# Patient Record
Sex: Male | Born: 1955 | Race: White | Hispanic: No | Marital: Married | Smoking: Never smoker
Health system: Southern US, Community
[De-identification: ages and names within clinical notes are randomized; demographics above are authoritative.]

---

## 2004-09-20 ENCOUNTER — Ambulatory Visit: Payer: Self-pay | Admitting: Internal Medicine

## 2005-01-24 ENCOUNTER — Ambulatory Visit: Payer: Self-pay | Admitting: Gastroenterology

## 2008-09-29 ENCOUNTER — Ambulatory Visit: Payer: Self-pay | Admitting: Family Medicine

## 2010-06-16 ENCOUNTER — Ambulatory Visit: Payer: Self-pay | Admitting: Internal Medicine

## 2016-01-08 ENCOUNTER — Other Ambulatory Visit: Payer: Self-pay | Admitting: Neurology

## 2016-01-08 DIAGNOSIS — G3184 Mild cognitive impairment, so stated: Secondary | ICD-10-CM

## 2016-01-08 DIAGNOSIS — Z87820 Personal history of traumatic brain injury: Secondary | ICD-10-CM

## 2016-02-05 ENCOUNTER — Ambulatory Visit: Admission: RE | Admit: 2016-02-05 | Payer: BLUE CROSS/BLUE SHIELD | Source: Ambulatory Visit

## 2016-10-22 ENCOUNTER — Ambulatory Visit
Admission: EM | Admit: 2016-10-22 | Discharge: 2016-10-22 | Disposition: A | Discharge status: Home / Self Care | Payer: Self-pay | Attending: Emergency Medicine | Admitting: Emergency Medicine

## 2016-10-22 ENCOUNTER — Ambulatory Visit (INDEPENDENT_AMBULATORY_CARE_PROVIDER_SITE_OTHER): Payer: Self-pay

## 2016-10-22 ENCOUNTER — Encounter: Payer: Self-pay | Admitting: *Deleted

## 2016-10-22 DIAGNOSIS — S61211A Laceration without foreign body of left index finger without damage to nail, initial encounter: Secondary | ICD-10-CM

## 2016-10-22 DIAGNOSIS — S61212A Laceration without foreign body of right middle finger without damage to nail, initial encounter: Secondary | ICD-10-CM

## 2016-10-22 MED ORDER — DOXYCYCLINE HYCLATE 100 MG PO CAPS: 100.0000 mg | ORAL_CAPSULE | Freq: Two times a day (BID) | ORAL | 0 refills | Status: AC

## 2016-10-22 MED ORDER — MUPIROCIN 2 % EX OINT: 1.0000 "application " | TOPICAL_OINTMENT | Freq: Three times a day (TID) | CUTANEOUS | 0 refills | Status: AC

## 2016-10-22 MED ORDER — TETANUS-DIPHTH-ACELL PERTUSSIS 5-2.5-18.5 LF-MCG/0.5 IM SUSP
0.5000 mL | Freq: Once | INTRAMUSCULAR | Status: AC
  Administered 2016-10-22: 0.5 mL via INTRAMUSCULAR

## 2016-10-22 NOTE — ED Provider Notes (Signed)
CSN: [id number]     Arrival date & time 10/22/16  1603 History   First MD Initiated Contact with Patient 10/22/16 1701     Chief Complaint  Patient presents with  . Laceration   (Consider location/radiation/quality/duration/timing/severity/associated sxs/prior Treatment) HPI   61 year old male lacerated his  Right dominant middle finger with a piece of metal that was slung into it by a grinder. He sustained laceration over the volar middle phalanx near the PIP joint. The laceration is transversely oriented approximately 2-1/2 cm. Is another laceration on his left index fingertip that is not as severe. He is not current on his tetanus toxoid.     History reviewed. No pertinent past medical history. History reviewed. No pertinent surgical history. History reviewed. No pertinent family history. Social History  Substance Use Topics  . Smoking status: Never Smoker  . Smokeless tobacco: Never Used  . Alcohol use Yes    Review of Systems  Constitutional: Positive for activity change. Negative for chills, fatigue and fever.  Skin: Positive for wound.  All other systems reviewed and are negative.   Allergies  Patient has no known allergies.  Home Medications   Prior to Admission medications   Medication Sig Start Date End Date Taking? Authorizing Provider  Multiple Vitamin (MULTIVITAMIN) capsule Take 1 capsule by mouth daily.   Yes Historical Provider, MD  naproxen sodium (ANAPROX) 220 MG tablet Take 220 mg by mouth 2 (two) times daily with a meal.   Yes Historical Provider, MD  doxycycline (VIBRAMYCIN) 100 MG capsule Take 1 capsule (100 mg total) by mouth 2 (two) times daily. 10/22/16   Lutricia Feil, PA-C  mupirocin ointment (BACTROBAN) 2 % Apply 1 application topically 3 (three) times daily. 10/22/16   Lutricia Feil, PA-C   Meds Ordered and Administered this Visit   Medications  Tdap (BOOSTRIX) injection 0.5 mL (0.5 mLs Intramuscular Given 10/22/16 1656)    BP 135/85 (BP  Location: Left Arm)   Pulse (!) 101   Temp 98.8 F (37.1 C) (Oral)   Resp 16   Ht 6' (1.829 m)   Wt 190 lb (86.2 kg)   SpO2 98%   BMI 25.77 kg/m  No data found.   Physical Exam  Constitutional: He is oriented to person, place, and time. He appears well-developed and well-nourished. No distress.  HENT:  Head: Normocephalic and atraumatic.  Eyes: Pupils are equal, round, and reactive to light.  Neck: Normal range of motion.  Musculoskeletal: Normal range of motion. He exhibits tenderness.  Examination of the right middle finger shows a laceration over the volar surface at the PIP joint transversely oriented measuring in total about 2-1/2 cm. FDS and FDP are strong with good pull-through. There is good sensation distally.  Neurological: He is alert and oriented to person, place, and time.  Skin: Skin is warm and dry. He is not diaphoretic.  Psychiatric: He has a normal mood and affect. His behavior is normal. Judgment and thought content normal.  Nursing note and vitals reviewed.   Urgent Care Course     .Marland KitchenLaceration Repair Date/Time: 10/22/2016 6:57 PM Performed by: Lutricia Feil Authorized by: Domenick Gong   Consent:    Consent obtained:  Verbal   Consent given by:  Patient   Risks discussed:  Infection, pain and poor wound healing   Alternatives discussed:  Referral Anesthesia (see MAR for exact dosages):    Anesthesia method:  Local infiltration   Local anesthetic:  Lidocaine 1% w/o epi Laceration  details:    Location:  Finger   Finger location:  R long finger   Length (cm):  2.5   Depth (mm):  4 Repair type:    Repair type:  Intermediate Pre-procedure details:    Preparation:  Patient was prepped and draped in usual sterile fashion and imaging obtained to evaluate for foreign bodies Exploration:    Hemostasis achieved with:  Direct pressure   Wound exploration: wound explored through full range of motion and entire depth of wound probed and visualized      Wound extent: areolar tissue violated     Contaminated: no   Treatment:    Area cleansed with:  Hibiclens   Amount of cleaning:  Standard   Irrigation solution:  Sterile saline   Irrigation volume:  30   Irrigation method:  Pressure wash   Visualized foreign bodies/material removed: no   Subcutaneous repair:    Suture size:  5-0   Suture material:  Vicryl   Number of sutures:  2 Skin repair:    Repair method:  Sutures   Suture size:  5-0   Suture technique:  Simple interrupted   Number of sutures:  6 Approximation:    Approximation:  Loose Post-procedure details:    Dressing:  Antibiotic ointment, sterile dressing and tube gauze   Patient tolerance of procedure:  Tolerated well, no immediate complications Comments:     2 4-0 Vicryl were utilized to close the subcutaneous tissue. Patient had 5 stitches of 4-0 nylon on the skin with 2 additional 6-0 nylon on the radial aspect of the laceration closing a V-type flap. Total of 7 stitches. Exploration of the wound showed the laceration to the level of the flexor tendon but the flexor tendon appeared intact from what can be seen through the laceration. Range of motion did not show any defect in the flexor tendon and prior to suturing of the flexor tendon was strong with good pull-through. Signs and symptoms of infection were reviewed in detail with the patient. Because the depths of the wound and the dirty material that he was grinding I did decide to put him on doxycycline. He will use Bactroban ointment on the wound for 10 days 3 times daily. Laceration on the left index finger was very small and Dermabond was utilized to close this wound.    (including critical care time)  Labs Review Labs Reviewed - No data to display  Imaging Review Dg Finger Middle Right  Result Date: 10/22/2016 CLINICAL DATA:  61 year old male with laceration. Initial encounter. EXAM: RIGHT MIDDLE FINGER 2+V COMPARISON:  None. FINDINGS: Laceration right middle  finger palmar aspect (proximal middle phalanx level) without underlying fracture or radiopaque foreign body. IMPRESSION: No fracture or radiopaque foreign body. Electronically Signed   By: Lacy Duverney M.D.   On: 10/22/2016 17:42     Visual Acuity Review  Right Eye Distance:   Left Eye Distance:   Bilateral Distance:    Right Eye Near:   Left Eye Near:    Bilateral Near:     Medications  Tdap (BOOSTRIX) injection 0.5 mL (0.5 mLs Intramuscular Given 10/22/16 1656)      MDM   1. Laceration of right middle finger without foreign body without damage to nail, initial encounter   2. Laceration of left index finger without foreign body without damage to nail, initial encounter    New Prescriptions   DOXYCYCLINE (VIBRAMYCIN) 100 MG CAPSULE    Take 1 capsule (100 mg total) by mouth  2 (two) times daily.   MUPIROCIN OINTMENT (BACTROBAN) 2 %    Apply 1 application topically 3 (three) times daily.  Plan: 1. Test/x-ray results and diagnosis reviewed with patient 2. rx as per orders; risks, benefits, potential side effects reviewed with patient 3. Recommend supportive treatment with Elevation.  patient was placed in a dorsal splint to protect the open wound and the decreased range of motion for a period of 3 days he will remove it at that point time and begin normal use. He will apply Bactroban ointment to the wound 3 times daily until the stitches are removed. Signs and symptoms of infection were outlined to the patient and he will return to our clinic if he has these develop 4. F/u prn if symptoms worsen or don't improve     Lutricia FeilWilliam P Kapil Petropoulos, PA-C 10/22/16 [address]    Ireoluwa Grant P Phillis KnackRoemer, [place] JerseyPA-C 10/22/16 1907

## 2016-10-22 NOTE — ED Triage Notes (Signed)
Pt using a grinder on a piece of metal, metal grabbed and was slung into pt's finger. Has a 1-1 1/2" laceration to middle finger of right hand. Bleed ing controlled with pressure.

## 2019-11-03 ENCOUNTER — Other Ambulatory Visit: Payer: Self-pay | Admitting: Gerontology

## 2019-11-03 DIAGNOSIS — R519 Headache, unspecified: Secondary | ICD-10-CM

## 2019-11-03 DIAGNOSIS — J0110 Acute frontal sinusitis, unspecified: Secondary | ICD-10-CM

## 2019-11-09 ENCOUNTER — Ambulatory Visit
Admission: RE | Admit: 2019-11-09 | Discharge: 2019-11-09 | Disposition: A | Discharge status: Home / Self Care | Payer: BC Managed Care – PPO | Source: Ambulatory Visit | Attending: Gerontology | Admitting: Gerontology

## 2019-11-09 ENCOUNTER — Other Ambulatory Visit: Payer: Self-pay

## 2019-11-09 DIAGNOSIS — G8929 Other chronic pain: Secondary | ICD-10-CM | POA: Insufficient documentation

## 2019-11-09 DIAGNOSIS — J0110 Acute frontal sinusitis, unspecified: Secondary | ICD-10-CM | POA: Insufficient documentation

## 2019-11-09 DIAGNOSIS — R519 Headache, unspecified: Secondary | ICD-10-CM | POA: Diagnosis present

## 2019-11-09 MED ORDER — GADOBUTROL 1 MMOL/ML IV SOLN
8.0000 mL | Freq: Once | INTRAVENOUS | Status: AC | PRN
  Administered 2019-11-09: 8 mL via INTRAVENOUS

## 2019-12-13 ENCOUNTER — Other Ambulatory Visit: Payer: Self-pay | Admitting: Gastroenterology

## 2019-12-13 DIAGNOSIS — R768 Other specified abnormal immunological findings in serum: Secondary | ICD-10-CM

## 2019-12-16 ENCOUNTER — Ambulatory Visit
Admission: RE | Admit: 2019-12-16 | Discharge: 2019-12-16 | Disposition: A | Discharge status: Home / Self Care | Payer: BC Managed Care – PPO | Source: Ambulatory Visit | Attending: Gastroenterology | Admitting: Gastroenterology

## 2019-12-16 ENCOUNTER — Other Ambulatory Visit: Payer: Self-pay

## 2019-12-16 DIAGNOSIS — R768 Other specified abnormal immunological findings in serum: Secondary | ICD-10-CM | POA: Insufficient documentation

## 2020-01-10 ENCOUNTER — Other Ambulatory Visit: Payer: Self-pay | Admitting: Gastroenterology

## 2020-05-04 ENCOUNTER — Other Ambulatory Visit: Payer: Self-pay | Admitting: Gastroenterology

## 2020-05-04 DIAGNOSIS — K7460 Unspecified cirrhosis of liver: Secondary | ICD-10-CM

## 2020-05-04 DIAGNOSIS — B1920 Unspecified viral hepatitis C without hepatic coma: Secondary | ICD-10-CM

## 2020-05-10 ENCOUNTER — Other Ambulatory Visit: Payer: Self-pay

## 2020-05-10 ENCOUNTER — Ambulatory Visit
Admission: RE | Admit: 2020-05-10 | Discharge: 2020-05-10 | Disposition: A | Discharge status: Home / Self Care | Payer: BC Managed Care – PPO | Source: Ambulatory Visit | Attending: Gastroenterology | Admitting: Gastroenterology

## 2020-05-10 DIAGNOSIS — K7460 Unspecified cirrhosis of liver: Secondary | ICD-10-CM | POA: Diagnosis present

## 2020-05-10 DIAGNOSIS — B1920 Unspecified viral hepatitis C without hepatic coma: Secondary | ICD-10-CM | POA: Diagnosis present

## 2020-11-02 ENCOUNTER — Other Ambulatory Visit: Payer: Self-pay | Admitting: Gastroenterology

## 2020-11-02 DIAGNOSIS — K7460 Unspecified cirrhosis of liver: Secondary | ICD-10-CM

## 2020-11-15 ENCOUNTER — Other Ambulatory Visit: Payer: Self-pay

## 2020-11-15 ENCOUNTER — Ambulatory Visit
Admission: RE | Admit: 2020-11-15 | Discharge: 2020-11-15 | Disposition: A | Discharge status: Home / Self Care | Payer: BC Managed Care – PPO | Source: Ambulatory Visit | Attending: Gastroenterology | Admitting: Gastroenterology

## 2020-11-15 DIAGNOSIS — K7460 Unspecified cirrhosis of liver: Secondary | ICD-10-CM | POA: Insufficient documentation

## 2021-10-04 ENCOUNTER — Other Ambulatory Visit: Payer: Self-pay | Admitting: Gastroenterology

## 2021-10-04 DIAGNOSIS — K7460 Unspecified cirrhosis of liver: Secondary | ICD-10-CM

## 2021-10-10 ENCOUNTER — Ambulatory Visit
Admission: RE | Admit: 2021-10-10 | Discharge: 2021-10-10 | Disposition: A | Discharge status: Home / Self Care | Payer: BC Managed Care – PPO | Source: Ambulatory Visit | Attending: Gastroenterology | Admitting: Gastroenterology

## 2021-10-10 DIAGNOSIS — K7460 Unspecified cirrhosis of liver: Secondary | ICD-10-CM

## 2021-12-18 IMAGING — US US ABDOMEN LIMITED
1 series · 14 of 25 positions shown · non-contrast
Comparison: 12/16/2019 complete abdominal ultrasound.

CLINICAL DATA: Liver cirrhosis, hepatitis C.

EXAM:
ULTRASOUND ABDOMEN LIMITED RIGHT UPPER QUADRANT

[Series 1: us abdomen limited · 0.19mm/px · 14 of 25 slices shown]
[im 1/25]
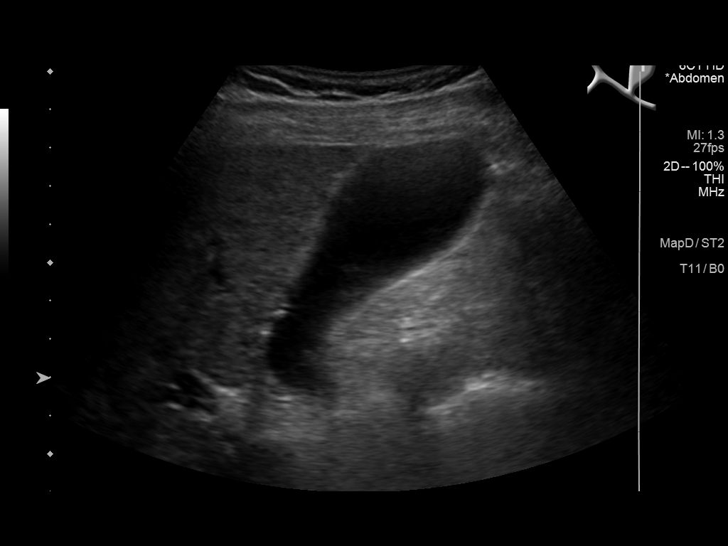
[im 3/25]
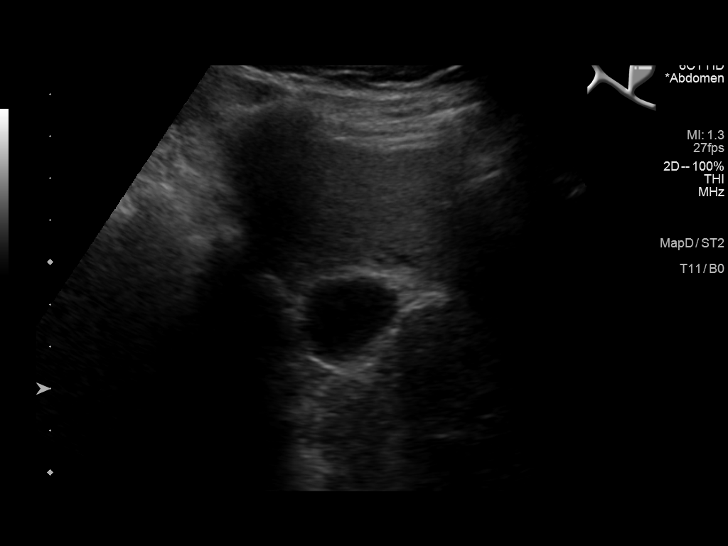
[im 5/25]
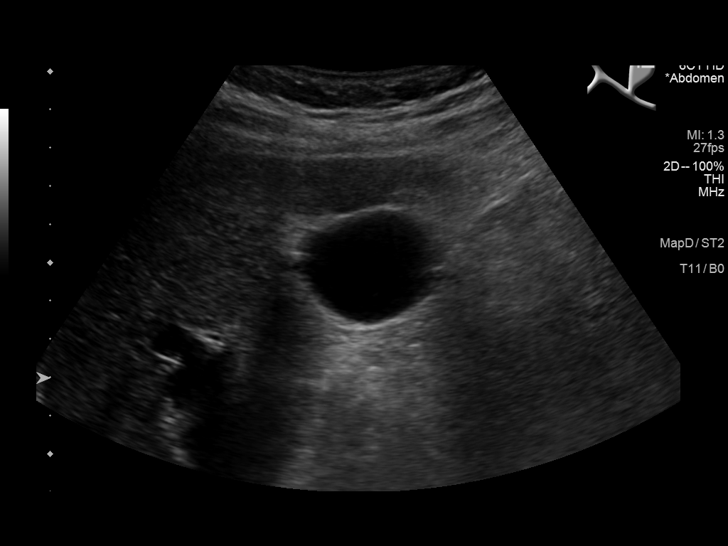
[im 7/25]
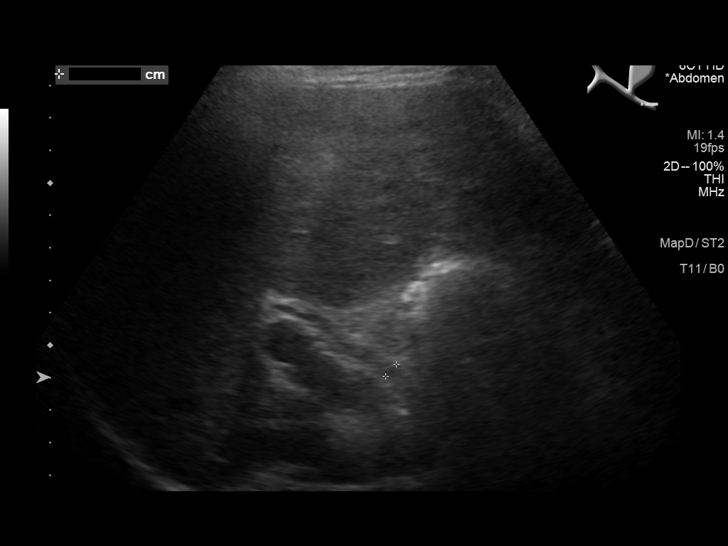
[im 9/25]
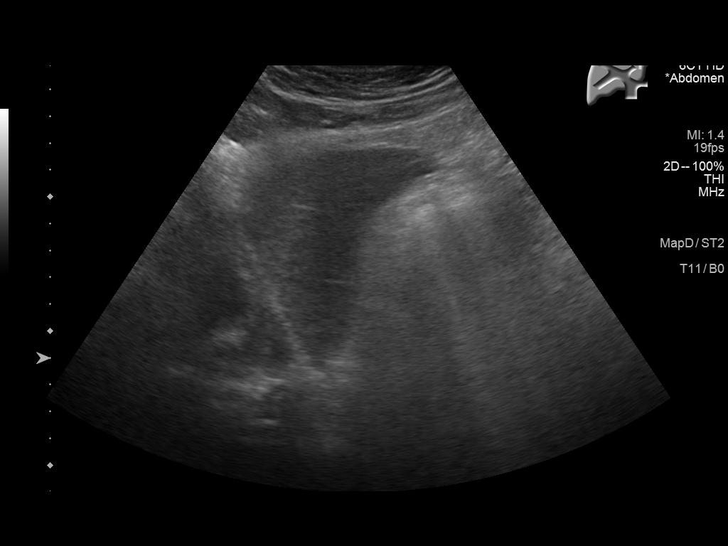
[im 10/25]
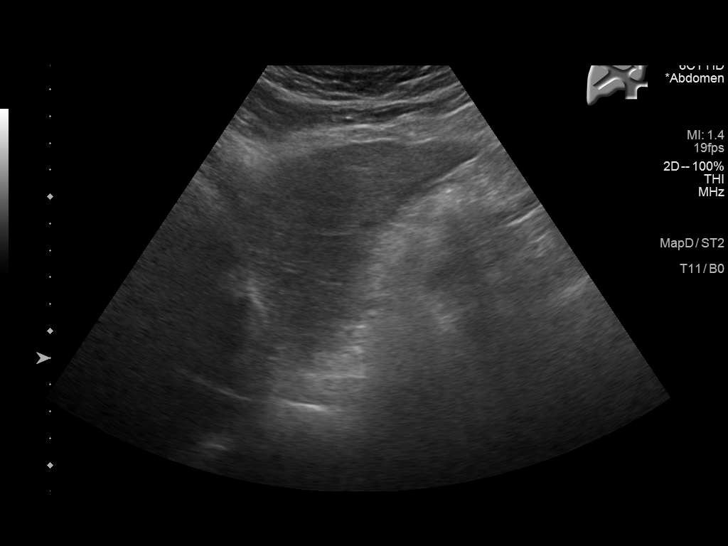
[im 12/25]
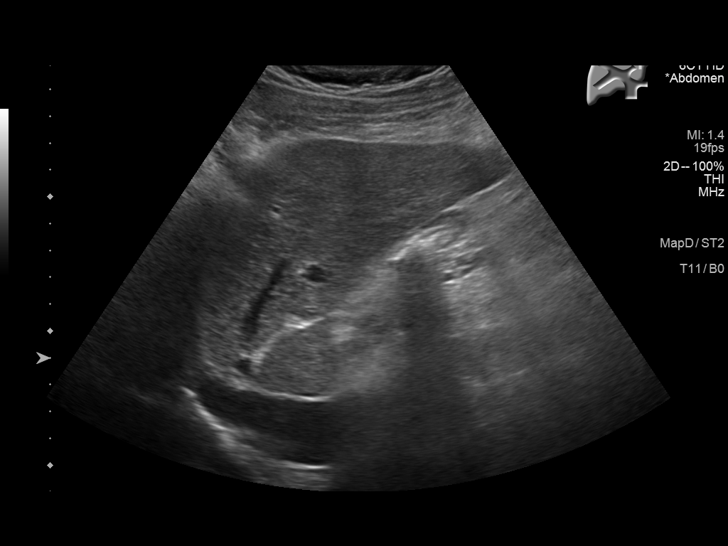
[im 14/25]
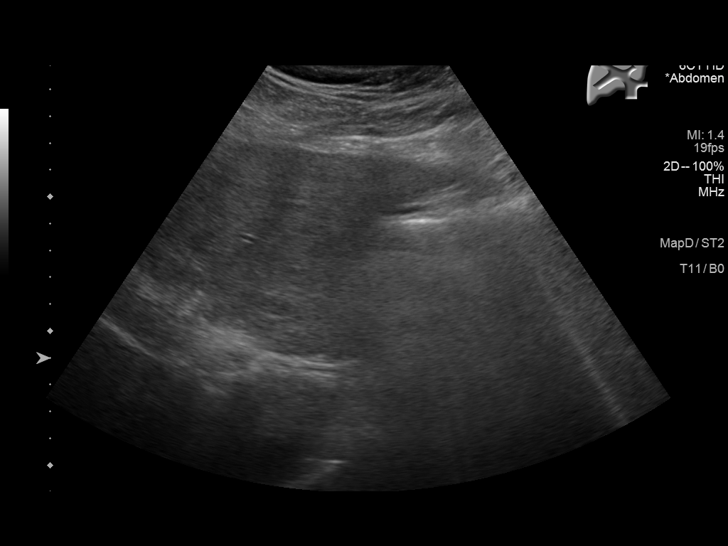
[im 16/25]
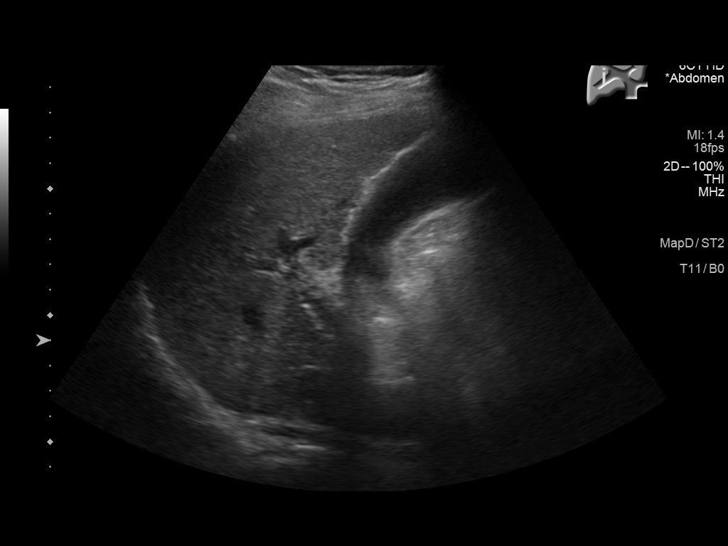
[im 17/25]
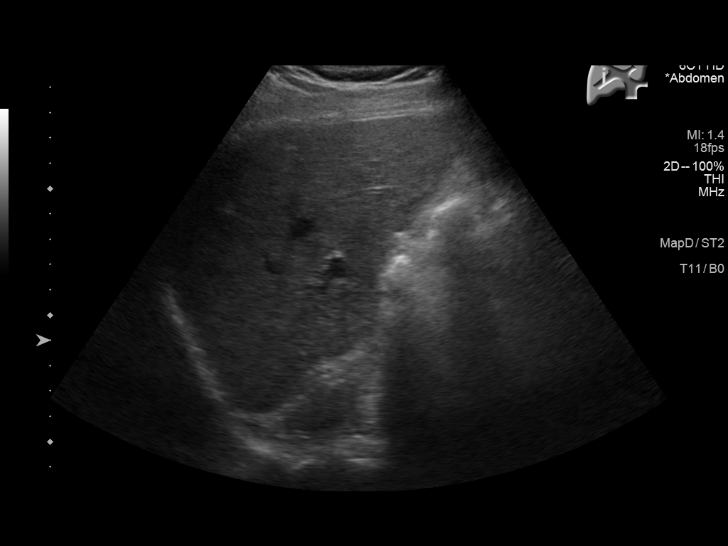
[im 19/25]
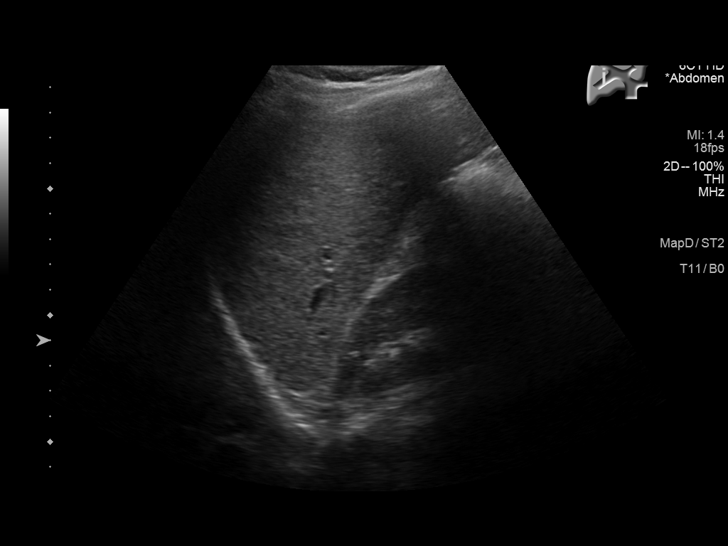
[im 21/25]
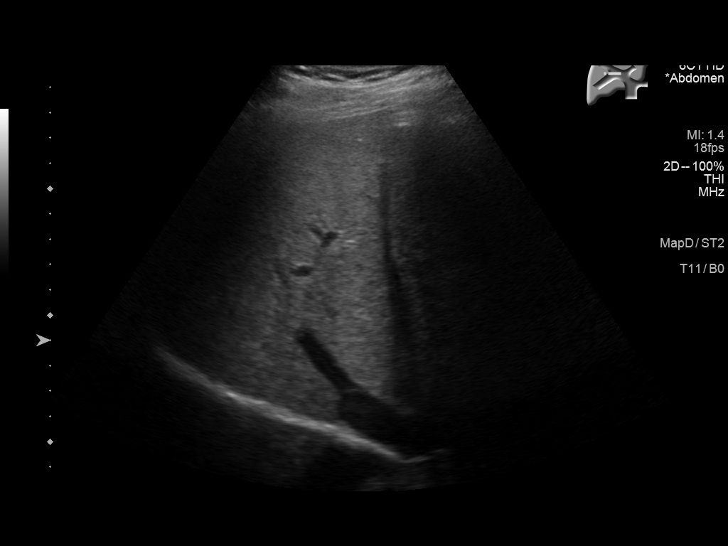
[im 23/25]
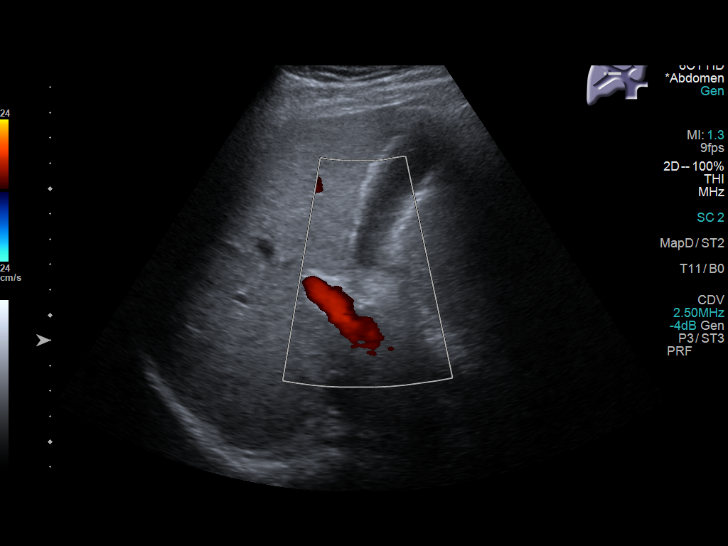
[im 25/25]
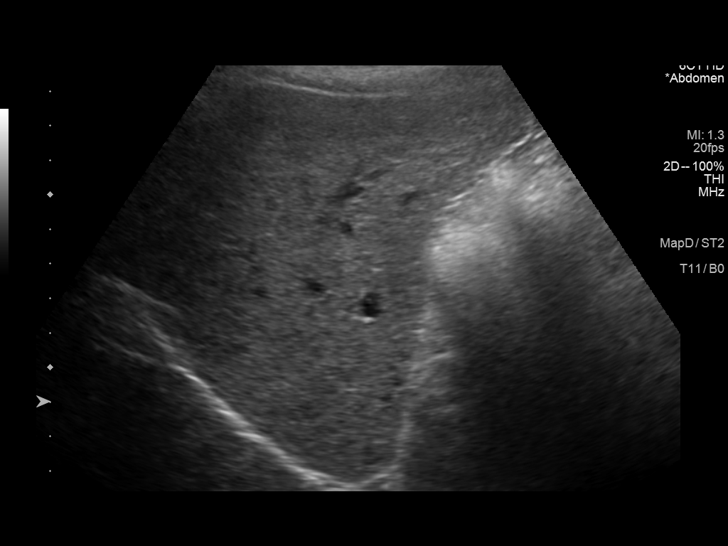

[14 of 25 positions shown; findings below may reference images not displayed]

FINDINGS: Gallbladder:

No gallstones or wall thickening visualized. No sonographic Murphy
sign noted by sonographer.

Common bile duct:

Diameter: 5.0 mm

Liver:

No focal lesion identified. Coarse parenchymal echotexture with
slightly undulating contour. Portal vein is patent on color Doppler
imaging with normal direction of blood flow towards the liver.

Other: None.
IMPRESSION: Coarse hepatic echotexture with undulating contour. No focal lesions
identified.

Normal sonographic appearance of the gallbladder and bile ducts.

## 2022-06-25 IMAGING — US US ABDOMEN COMPLETE
1 series · 13 of 25 positions shown · non-contrast
Comparison: Abdominal ultrasound May 10, 2020.

CLINICAL DATA: Cirrhosis

EXAM:
ABDOMEN ULTRASOUND COMPLETE

[Series 1: us abdomen complete · 13 of 114 slices shown]
[im 1/114]
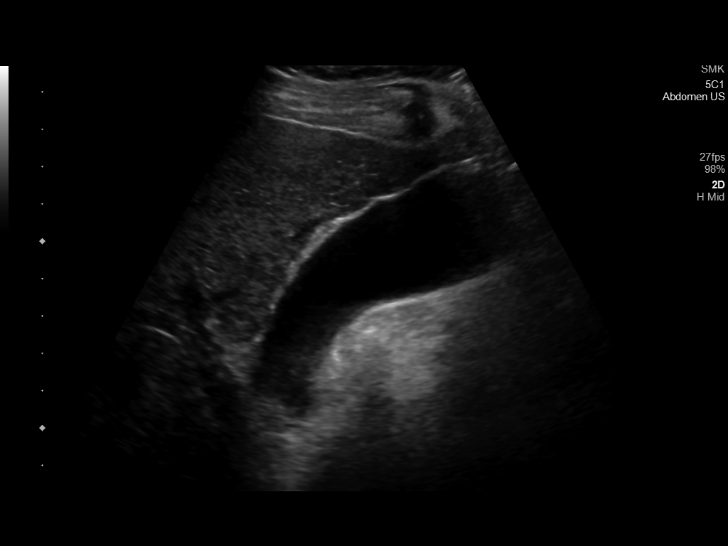
[im 10/114]
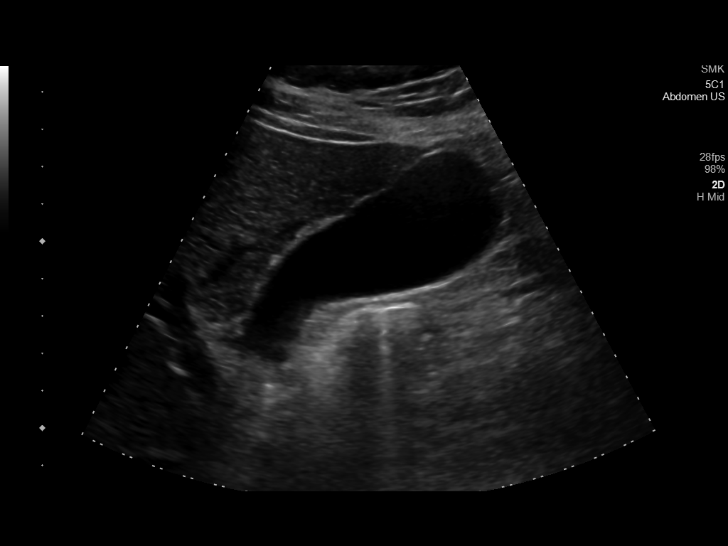
[im 19/114]
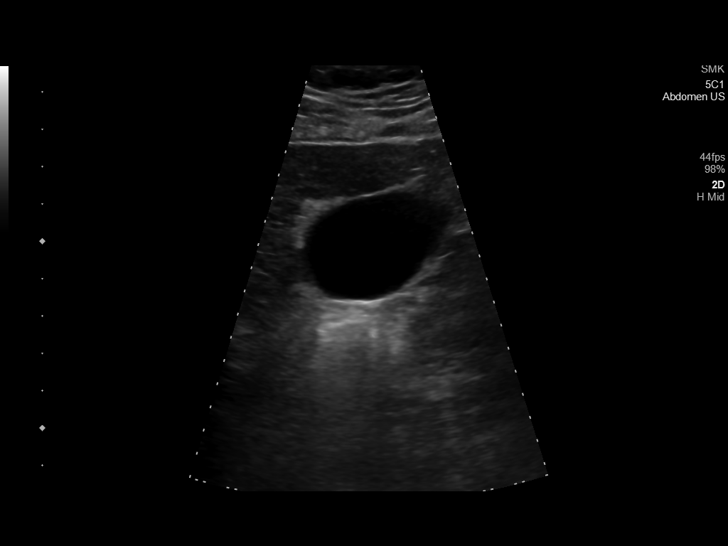
[im 29/114]
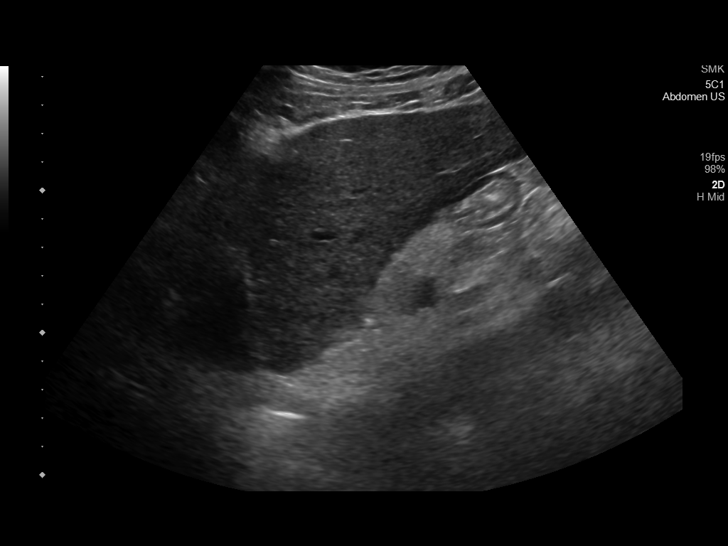
[im 38/114]
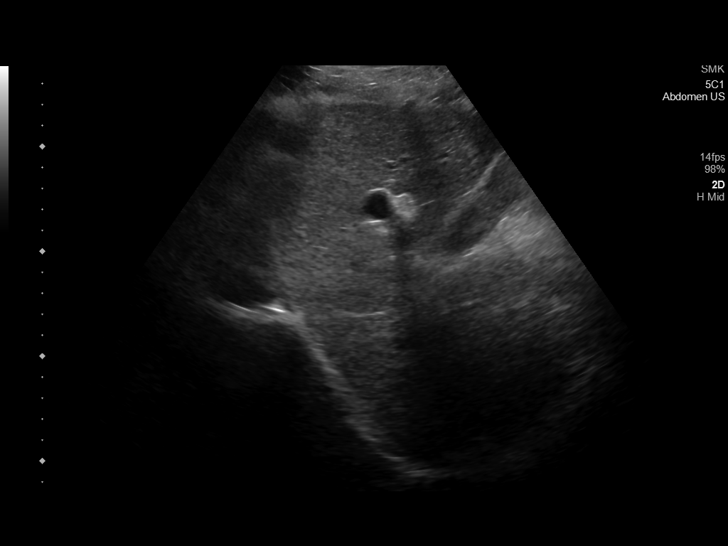
[im 48/114]
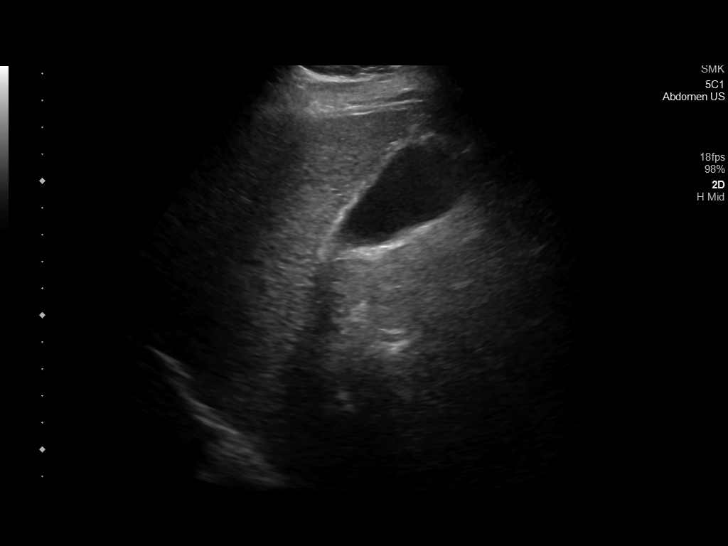
[im 57/114]
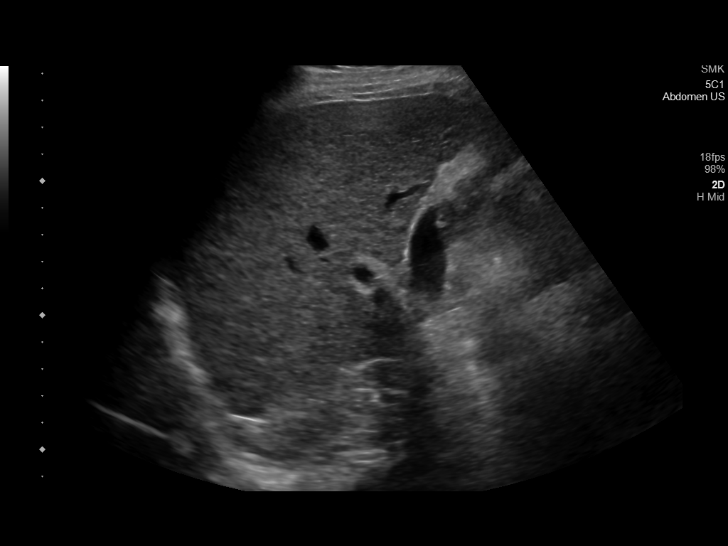
[im 66/114]
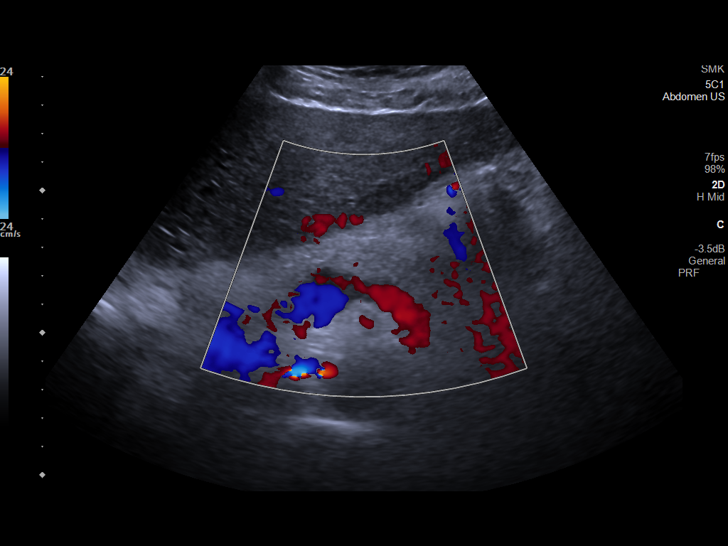
[im 76/114]
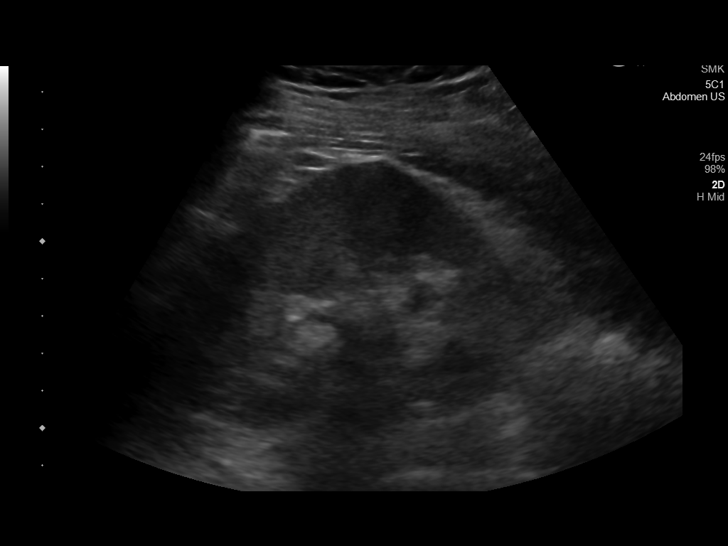
[im 85/114]
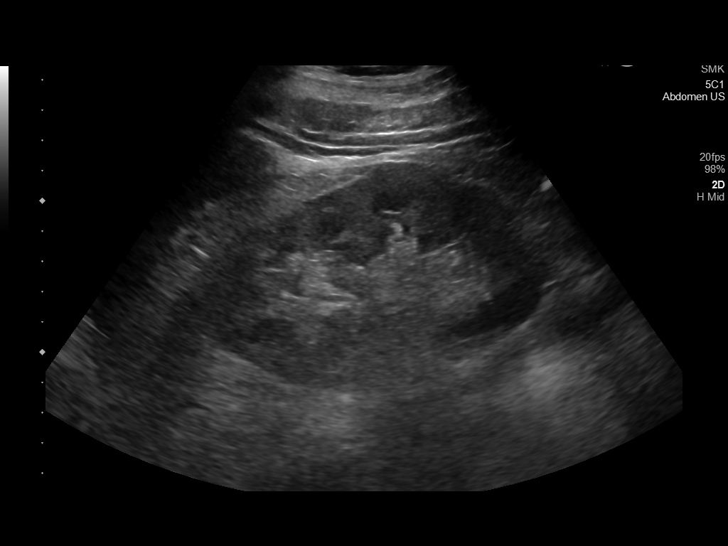
[im 95/114]
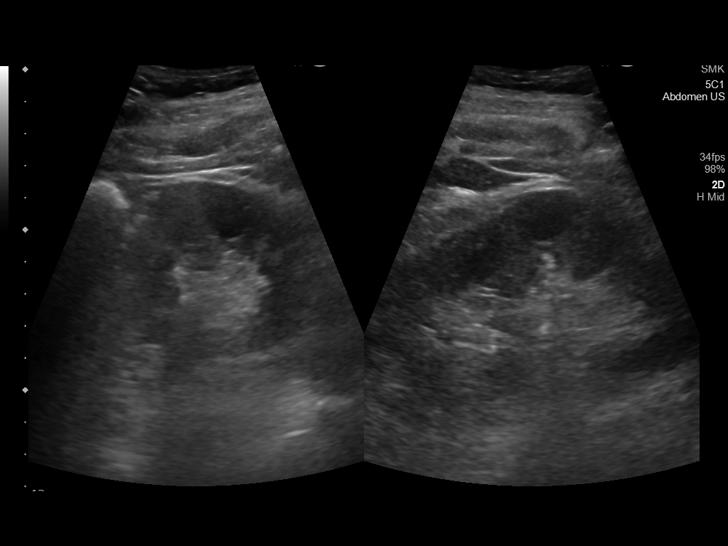
[im 104/114]
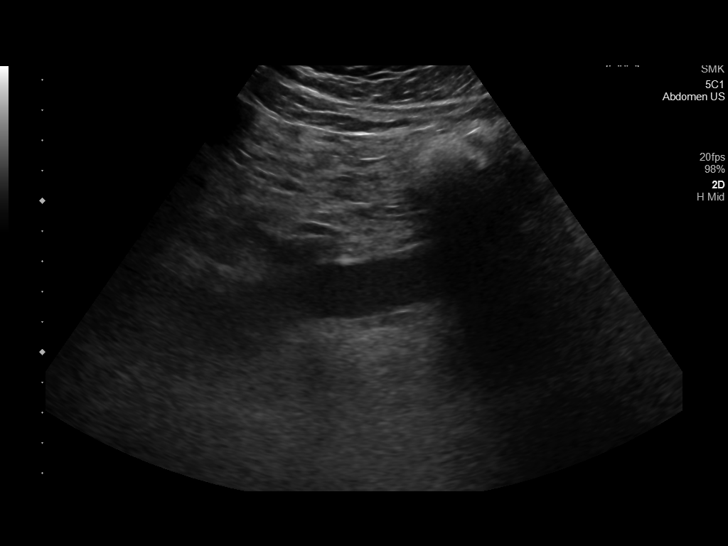
[im 114/114]
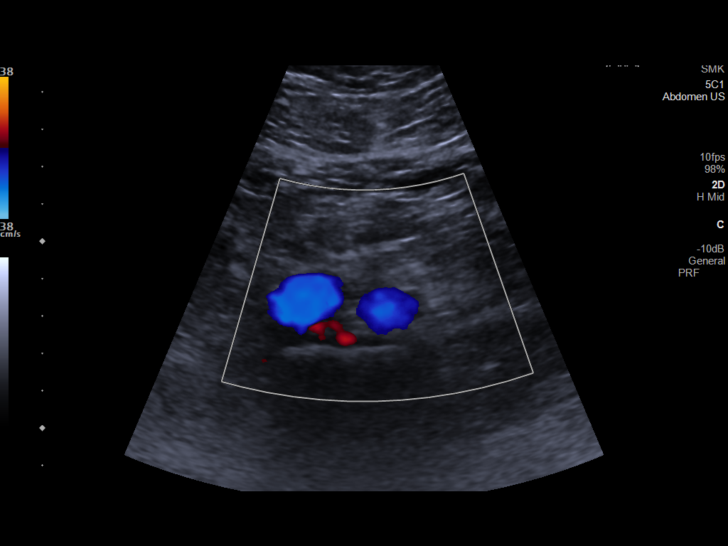

[13 of 25 positions shown; findings below may reference images not displayed]

FINDINGS: Gallbladder: No gallstones or pericholecystic fluid visualized.
Upper limits of normal wall thickness measuring 3 mm, likely related
to hepatic dysfunction. No sonographic Murphy sign noted by
sonographer.

Common bile duct: Diameter: 5 mm

Liver: No focal lesion identified. Coarsened echotexture with
nodular hepatic contour. Portal vein is patent on color Doppler
imaging with normal direction of blood flow towards the liver.

IVC: No abnormality visualized.

Pancreas: Visualized portion unremarkable.

Spleen: Size and appearance within normal limits.

Right Kidney: Length: 10.9 cm. Echogenicity within normal limits. No
mass or hydronephrosis visualized.

Left Kidney: Length: 12 cm. Echogenicity within normal limits.
cm renal cyst. No mass or hydronephrosis visualized.

Abdominal aorta: Aortic atherosclerosis with aneurysmal dilation of
the abdominal aorta measuring 3 cm in the proximal aspect.

Other findings: None.
IMPRESSION: Cirrhotic morphology of the liver. No focal hepatic lesion
identified.

Aortic atherosclerosis with abdominal aortic aneurysmal dilation
measuring up to 3 cm. Recommend follow-up ultrasound every 3 years.
This recommendation follows ACR consensus guidelines: White Paper of
the ACR Incidental Findings Committee II on Vascular Findings. [HOSPITAL] 9870; [DATE].

## 2023-05-20 IMAGING — US US ABDOMEN COMPLETE
2 series · 13 of 25 positions shown · non-contrast
Comparison: 11/15/2020

CLINICAL DATA: Cirrhosis without ascites.

EXAM:
ABDOMEN ULTRASOUND COMPLETE

[Series 1: us abdomen complete · 0.17mm/px · 12 of 137 slices shown (1 of 2)]
[im 1/137]
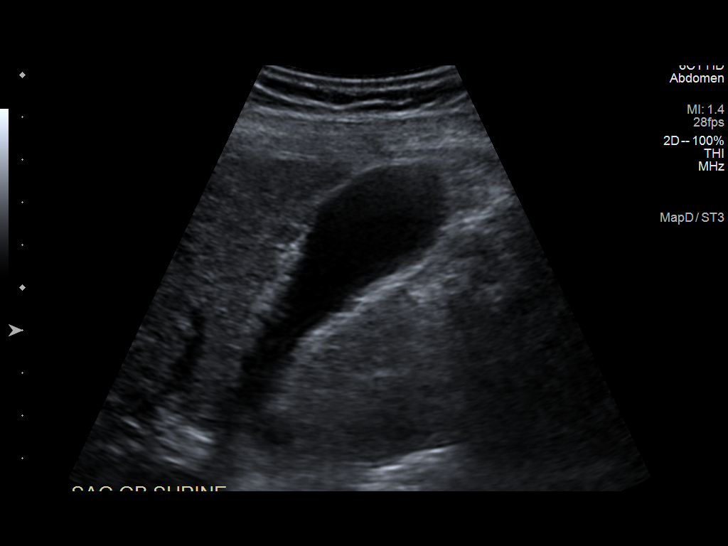
[im 12/137]
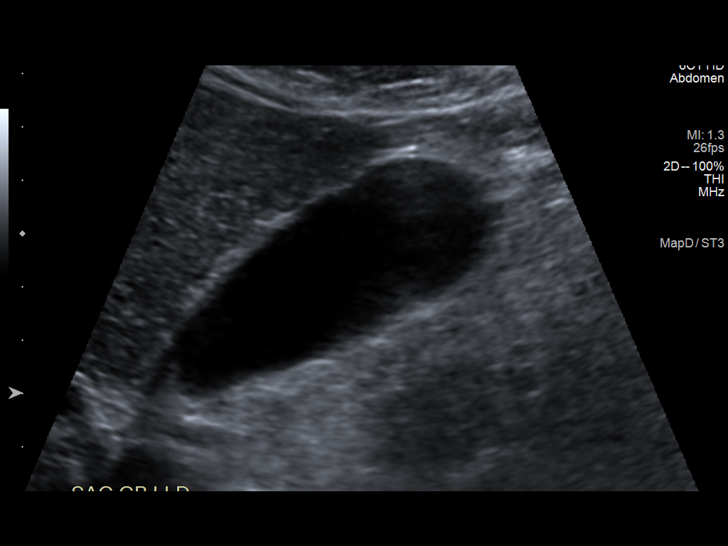
[im 24/137]
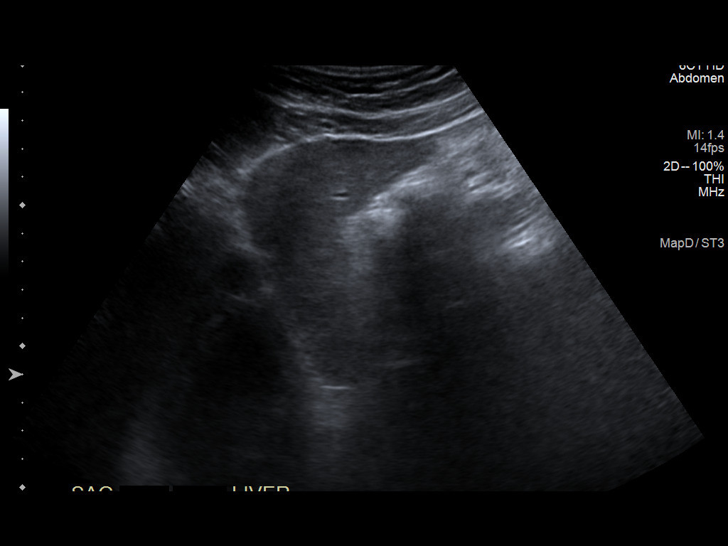
[im 36/137]
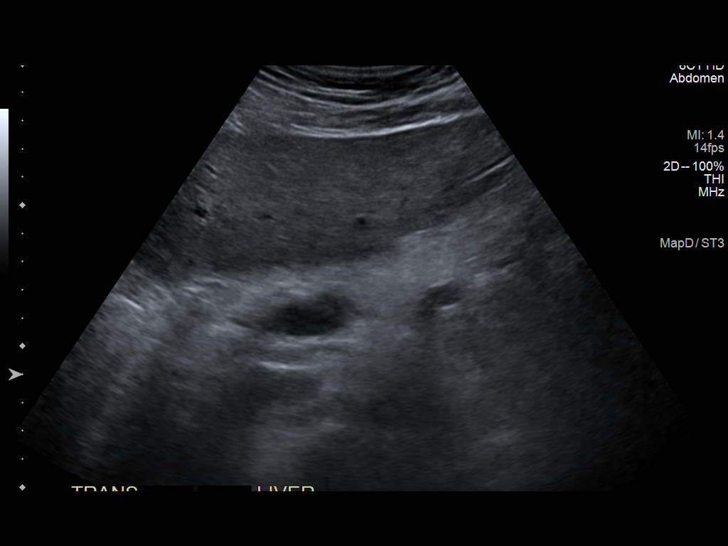
[im 48/137]
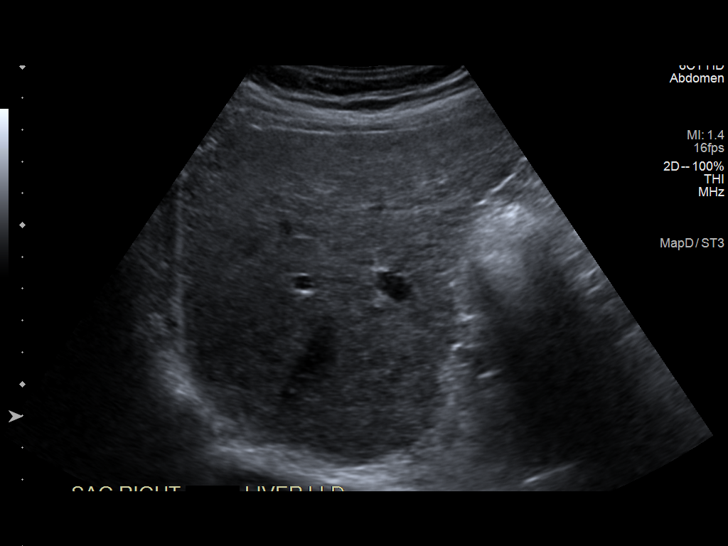
[im 60/137]
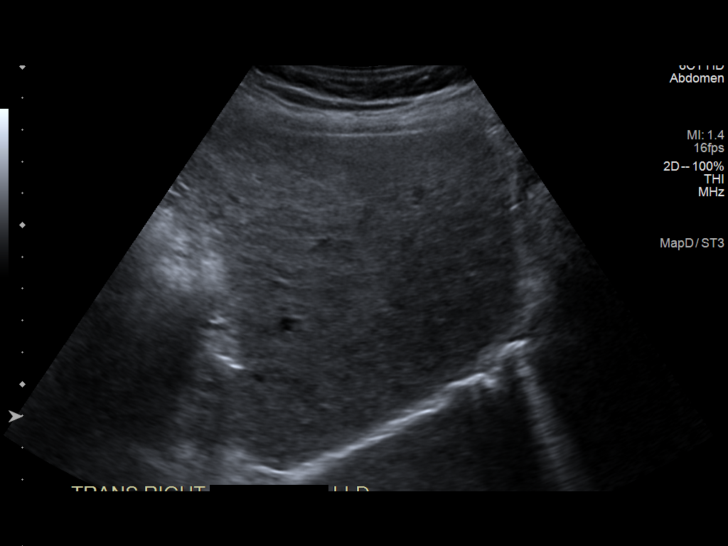
[im 71/137]
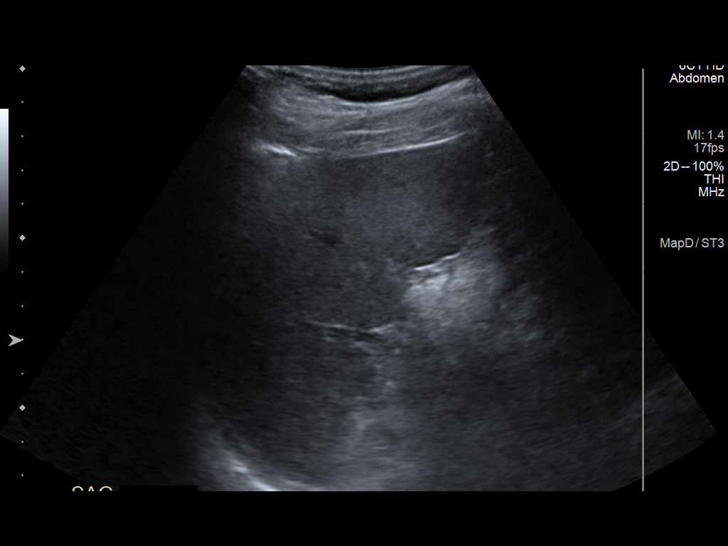
[im 83/137]
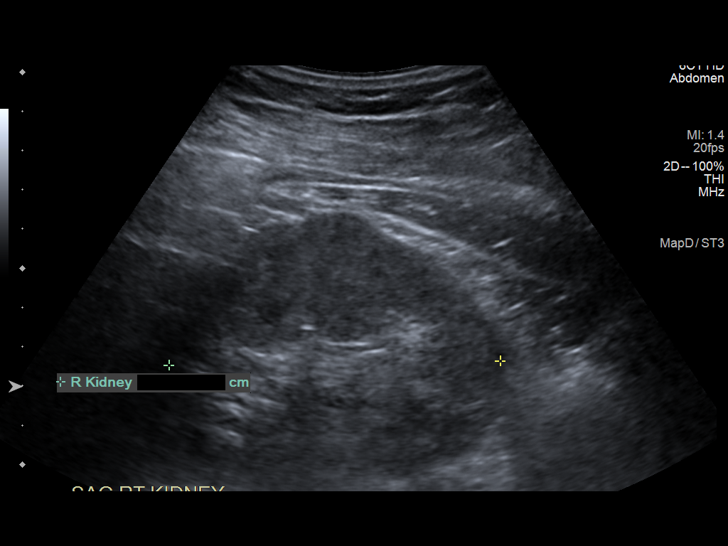
[im 95/137]
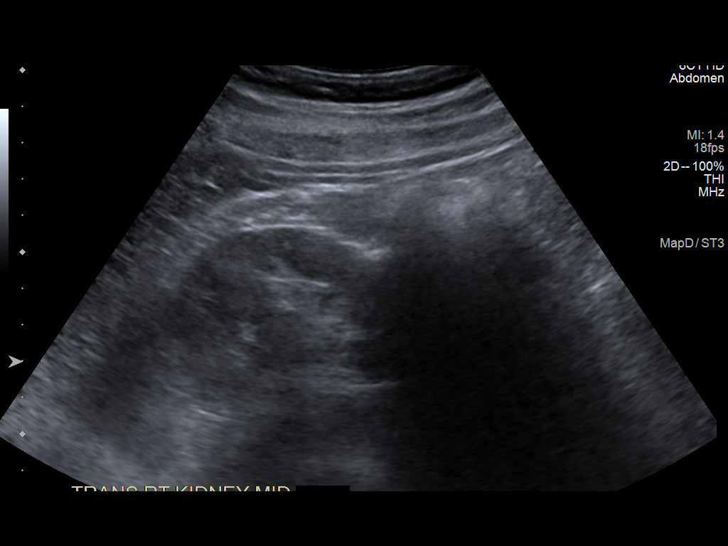
[im 107/137]
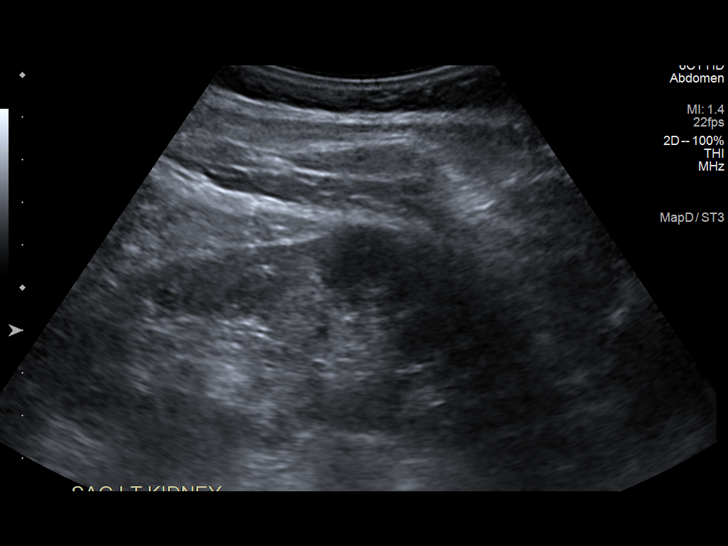
[im 119/137]
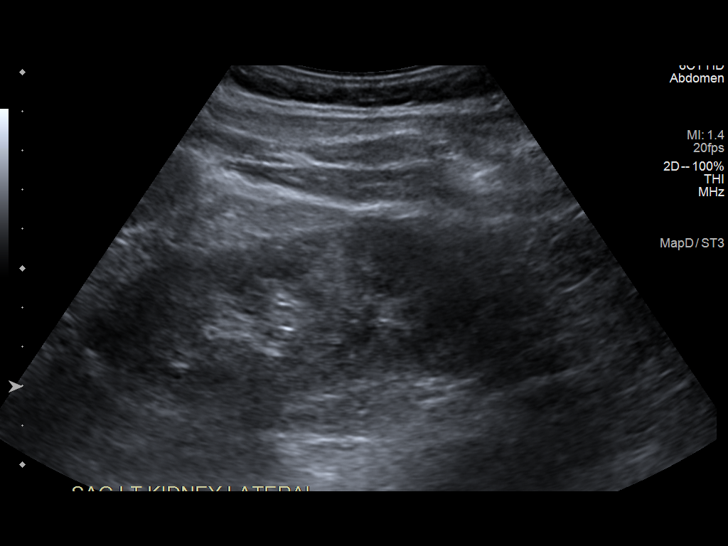
[im 131/137]
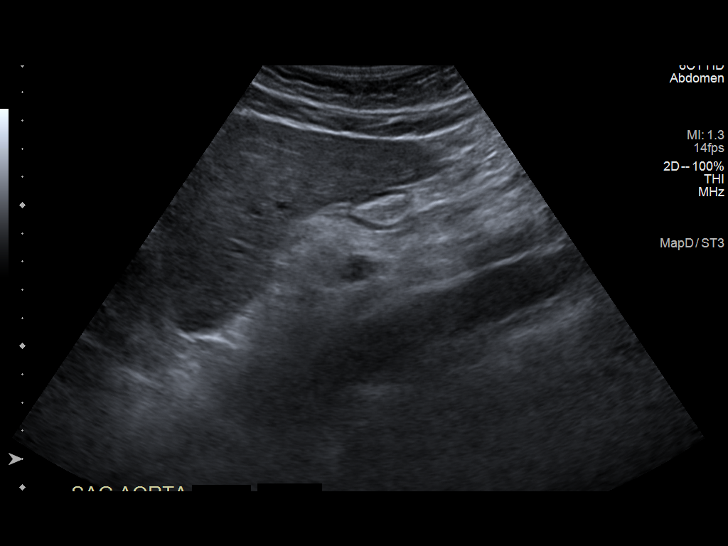

[Series 2001: us abdomen complete · 0.18mm/px · 1 of 1 slices shown (2 of 2)]
[im 1/1]
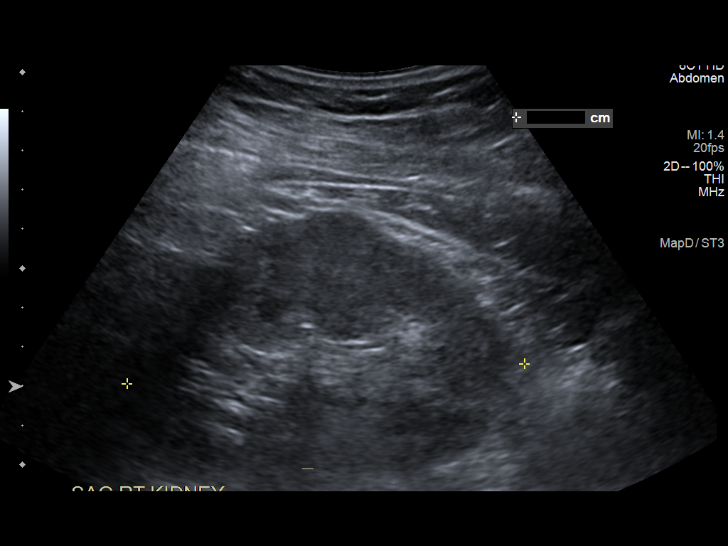

[13 of 25 positions shown; findings below may reference images not displayed]

FINDINGS: Gallbladder: No gallstones or wall thickening visualized. No
sonographic Murphy sign noted by sonographer.

Common bile duct: Diameter: 1.6 mm.

Liver: Mild nodularity of the liver contour with hazy coarse
increased parenchymal echogenicity compatible with known cirrhosis.
No focal mass. Portal vein is patent on color Doppler imaging with
normal direction of blood flow towards the liver.

IVC: No abnormality visualized.

Pancreas: Visualized portion unremarkable.

Spleen: Size and appearance within normal limits.

Right Kidney: Length: 10.1 cm. Echogenicity within normal limits. No
mass or hydronephrosis visualized.

Left Kidney: Length: 11.5 cm. Echogenicity within normal limits. No
mass or hydronephrosis visualized. 1.3 cm cyst over the mid pole.

Abdominal aorta: No aneurysm visualized.

Other findings: None.
IMPRESSION: 1.  No acute hepatobiliary disease.

2.  Mild cirrhotic morphology of the liver.  No focal mass.

3.  1.3 cm left renal cyst.

## 2024-09-23 DIAGNOSIS — N433 Hydrocele, unspecified: Secondary | ICD-10-CM | POA: Insufficient documentation

## 2024-09-23 DIAGNOSIS — N400 Enlarged prostate without lower urinary tract symptoms: Secondary | ICD-10-CM | POA: Insufficient documentation

## 2024-09-23 NOTE — Assessment & Plan Note (Addendum)
 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 Kassie  - on 0.8 mg Flomax + 2mg  daily Cardura  - outside CT report - mild BPH w/ median lobe, cannot exclude urothelial lesion   PVR 1 ml today IPSS 22/4  Reviewed his clinical history, and outside records.  Seems to have chronic but progressive BPH LUTS w/ intermittent straining to void. Reported TUMT ~15 year ago, possible prostatic regrowth. Currently, on an atypical regimen of 2 alpha blockers.  I would recommend office cystoscopy and TRUS prostate sizing.  He may certainly benefit from a surgical outlet procedure, reduce his polypharmacy burden.   - schedule office cystoscopy + TRUS prostate sizing, next available

## 2024-09-23 NOTE — Assessment & Plan Note (Signed)
 Simple Left hydrocele

## 2024-09-23 NOTE — Progress Notes (Signed)
" ° °  09/29/24 10:24 AM   Ralph Moore 09/01/1955 [id number]   HPI: 69 y.o. male here for initial evaluation of BPH  HX of BPH  - on 0.8 mg Flomax + 2mg  daily Cardura- per PCP  - Reportedly followed by Dr. Gala many years ago  - s/p TUMT ~15 years ago   - Slow progression in LUTS  - IPSS 22/4 today  CT Pelvis (Dec 2025, via [facility], unable to view images) - report suggests mildly enlarged prostate, median lobe, cannot exclude bladder urothelial lesion. Also, left scrotal hydrocele.   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  Surgical History: No past surgical history on file.  Family History: No family history on file.  Social History:  reports that he has never smoked. He has never used smokeless tobacco. He reports current alcohol use. No history on file for drug use.      Physical Exam: BP 117/85   Pulse (!) 105   Ht 6' (1.829 m)   Wt 198 lb (89.8 kg)   BMI 26.85 kg/m    Constitutional:  Alert and oriented, No acute distress. Cardiovascular: No clubbing, cyanosis, or edema. Respiratory: Normal respiratory effort, no increased work of breathing. GI: Nondistended GU: Deferred Skin: No rashes, bruises or suspicious lesions. Neurologic: Grossly intact, no focal deficits, moving all 4 extremities. Psychiatric: Normal mood and affect.  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 Kassie  - on 0.8 mg Flomax + 2mg  daily Cardura  - outside CT report - mild BPH w/ median lobe, cannot exclude urothelial lesion   PVR 1 ml today IPSS 22/4  Reviewed his clinical history, and outside records.  Seems to  have chronic but progressive BPH LUTS w/ intermittent straining to void. Reported TUMT ~15 year ago, possible prostatic regrowth. Currently, on an atypical regimen of 2 alpha blockers.  I would recommend office cystoscopy and TRUS prostate sizing.  He may certainly benefit from a surgical outlet procedure, reduce his polypharmacy burden.   - schedule office cystoscopy + TRUS prostate sizing, next available    Hydrocele, unspecified hydrocele type      [name], MD 09/29/2024  [facility] [address] [place], [place] [address] [phone] "

## 2024-09-29 ENCOUNTER — Encounter: Payer: Self-pay | Admitting: Urology

## 2024-09-29 ENCOUNTER — Telehealth: Payer: Self-pay

## 2024-09-29 ENCOUNTER — Ambulatory Visit: Payer: Self-pay | Admitting: Urology

## 2024-09-29 VITALS — BP 117/85 | HR 105 | Ht 72.0 in | Wt 198.0 lb

## 2024-09-29 DIAGNOSIS — N401 Enlarged prostate with lower urinary tract symptoms: Secondary | ICD-10-CM | POA: Diagnosis not present

## 2024-09-29 DIAGNOSIS — N433 Hydrocele, unspecified: Secondary | ICD-10-CM | POA: Diagnosis not present

## 2024-09-29 NOTE — Telephone Encounter (Signed)
 Called patient to ask when he would like to pickup his flash drive with his medical records. He said he would try to come by sometime tomorrow or if he can't make it to have us  hold it until is upcoming appointment on 10/13/2024. I agreed that was okay I told him I would leave it up near the front desk whenever he decides to get it. -Myrtha Rubinstein, CMA

## 2024-09-30 NOTE — Progress Notes (Unsigned)
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 Kassie  - on 0.8 mg Flomax + 2mg  daily Cardura  - outside CT report - mild BPH w/ median lobe, cannot exclude urothelial lesion   Cystoscopy: After informed consent and discussion of the procedure and its risks, Ralph Moore was positioned and prepped in the standard fashion. Cystoscopy was performed with a flexible cystoscope. The urethra, bladder neck and bladder mucosa were visualized in a systematic fashion. The ureteral orifices were noted in orthotopic location and orientation. There were no bladder mucosal lesions, stones, debris or anatomic variants noted. The prostate gland was {[id number]}.  Transrectal US : The patient was repositioned in left lateral decubitus. A transrectal ultrasound probe was introduced and the prostate gland was surveyed. There were no hypoechoic lesions or abnormal capsular contours. There did not*** appear to be an intravesical median lobe. Measurements were taken, sizing the total prostate volume at ***cc.   Findings: ***  Assessment and Plan: ***  Ralph Skye, MD 09/30/2024   "

## 2024-10-13 ENCOUNTER — Other Ambulatory Visit: Admitting: Urology
